# Patient Record
Sex: Female | Born: 1955 | Race: Black or African American | Hispanic: No | Marital: Married | Smoking: Never smoker
Health system: Southern US, Community
[De-identification: ages and names within clinical notes are randomized; demographics above are authoritative.]

## PROBLEM LIST (undated history)

## (undated) DIAGNOSIS — E7800 Pure hypercholesterolemia, unspecified: Secondary | ICD-10-CM

## (undated) DIAGNOSIS — I10 Essential (primary) hypertension: Secondary | ICD-10-CM

## (undated) DIAGNOSIS — T7840XA Allergy, unspecified, initial encounter: Secondary | ICD-10-CM

## (undated) DIAGNOSIS — M1990 Unspecified osteoarthritis, unspecified site: Secondary | ICD-10-CM

## (undated) HISTORY — DX: Allergy, unspecified, initial encounter: T78.40XA

## (undated) HISTORY — PX: AXILLARY SURGERY: SHX892

## (undated) HISTORY — PX: OTHER SURGICAL HISTORY: SHX169

## (undated) HISTORY — PX: ECTOPIC PREGNANCY SURGERY: SHX613

## (undated) HISTORY — DX: Unspecified osteoarthritis, unspecified site: M19.90

## (undated) HISTORY — PX: FOOT SURGERY: SHX648

## (undated) HISTORY — DX: Pure hypercholesterolemia, unspecified: E78.00

## (undated) HISTORY — DX: Essential (primary) hypertension: I10

## (undated) HISTORY — PX: HAND SURGERY: SHX662

## (undated) HISTORY — PX: TONSILLECTOMY: SUR1361

---

## 2002-02-07 ENCOUNTER — Other Ambulatory Visit: Admission: RE | Admit: 2002-02-07 | Discharge: 2002-02-07 | Payer: Self-pay | Admitting: Obstetrics & Gynecology

## 2002-02-23 ENCOUNTER — Encounter: Payer: Self-pay | Admitting: Obstetrics & Gynecology

## 2002-02-23 ENCOUNTER — Encounter: Admission: RE | Admit: 2002-02-23 | Discharge: 2002-02-23 | Payer: Self-pay | Admitting: Obstetrics & Gynecology

## 2002-10-26 ENCOUNTER — Ambulatory Visit (HOSPITAL_COMMUNITY): Admission: RE | Admit: 2002-10-26 | Discharge: 2002-10-26 | Payer: Self-pay | Admitting: Gastroenterology

## 2003-07-18 ENCOUNTER — Other Ambulatory Visit: Admission: RE | Admit: 2003-07-18 | Discharge: 2003-07-18 | Payer: Self-pay | Admitting: Obstetrics & Gynecology

## 2003-08-12 ENCOUNTER — Encounter: Admission: RE | Admit: 2003-08-12 | Discharge: 2003-08-12 | Payer: Self-pay | Admitting: Obstetrics & Gynecology

## 2005-08-09 ENCOUNTER — Encounter: Admission: RE | Admit: 2005-08-09 | Discharge: 2005-08-09 | Payer: Self-pay | Admitting: Obstetrics & Gynecology

## 2006-08-15 ENCOUNTER — Encounter: Admission: RE | Admit: 2006-08-15 | Discharge: 2006-08-15 | Payer: Self-pay | Admitting: Obstetrics & Gynecology

## 2009-09-11 ENCOUNTER — Encounter: Admission: RE | Admit: 2009-09-11 | Discharge: 2009-09-11 | Payer: Self-pay | Admitting: Obstetrics & Gynecology

## 2010-03-01 HISTORY — PX: CHEST WALL TUMOR EXCISION: SUR562

## 2010-03-01 HISTORY — PX: OTHER SURGICAL HISTORY: SHX169

## 2010-03-22 ENCOUNTER — Encounter: Payer: Self-pay | Admitting: Obstetrics & Gynecology

## 2010-07-17 NOTE — Op Note (Signed)
   NAMESAYLAH, KETNER                    ACCOUNT NO.:  [id number]   MEDICAL RECORD NO.:  [id number]                   PATIENT TYPE:  AMB   LOCATION:  ENDO                                 FACILITY:  [facility]   PHYSICIAN:  Danise Edge, M.D.                DATE OF BIRTH:  10/26/1955   DATE OF PROCEDURE:  10/26/2002  DATE OF DISCHARGE:                                 OPERATIVE REPORT   PROCEDURE:  Diagnostic colonoscopy.   INDICATIONS FOR PROCEDURE:  Ms. Charmon Thorson is a 55 year old female  born 1955/04/17. When Ms. Camilo was diagnosed with iron deficiency  anemia, she was placed on iron sulfate three times daily. On August 27, 2002,  her serum iron saturation was measured at 75% and her hemoglobin was 11.7 g.  She tells me she was instructed to decrease her iron to every other day. On  October 17, 2002, her serum iron was 137 and her hemoglobin 12.2 g (Jul 03, 2002 serum iron saturation 3%).   Ms. Fein is scheduled to undergo her first screening colonoscopy with  polypectomy to prevent colon cancer.   ENDOSCOPIST:  Charolett Bumpers, M.D.   PREMEDICATION:  Versed 7.5 mg, Demerol 50 mg .   DESCRIPTION OF PROCEDURE:  After obtaining informed consent, Ms. Laflam was  placed in the left lateral decubitus position. I administered intravenous  Demerol and intravenous Versed to achieve conscious sedation for the  procedure. The patient's blood pressure, oxygen saturation and cardiac  rhythm were monitored throughout the procedure and documented in the medical  record.   Anal inspection was normal. Digital rectal exam was normal. The Olympus  adult colonoscope was introduced into the rectum and easily advanced to the  cecum. Colonic preparation for the exam today was excellent.   RECTUM:  Normal.   SIGMOID COLON AND DESCENDING COLON:  Normal.   SPLENIC FLEXURE:  Normal.   TRANSVERSE COLON:  Normal.   HEPATIC FLEXURE:  Normal.   ASCENDING COLON:  Normal.   CECUM AND ILEOCECAL VALVE:  Normal.   ASSESSMENT:  Normal proctocolonoscopy to the cecum. No endoscopic evidence  for the presence of colorectal neoplasia, lower gastrointestinal bleeding or  inflammatory bowel disease.                                               Danise Edge, M.D.    MJ/MEDQ  D:  10/26/2002  T:  10/26/2002  Job:  [id number]   cc:   Georgann Housekeeper, M.D.  [address]  [place]  [place] [address]  Fax: [phone]   Lilla Shook, M.D.  [address]  [place]  [place]  [address]  Fax: [phone]   [facility]

## 2010-07-31 ENCOUNTER — Encounter (INDEPENDENT_AMBULATORY_CARE_PROVIDER_SITE_OTHER): Payer: Self-pay | Admitting: Surgery

## 2010-08-28 ENCOUNTER — Ambulatory Visit (INDEPENDENT_AMBULATORY_CARE_PROVIDER_SITE_OTHER): Payer: 59 | Admitting: Surgery

## 2010-08-28 ENCOUNTER — Encounter (INDEPENDENT_AMBULATORY_CARE_PROVIDER_SITE_OTHER): Payer: Self-pay | Admitting: Surgery

## 2010-08-28 DIAGNOSIS — L02412 Cutaneous abscess of left axilla: Secondary | ICD-10-CM

## 2010-08-28 DIAGNOSIS — IMO0002 Reserved for concepts with insufficient information to code with codable children: Secondary | ICD-10-CM

## 2010-08-28 NOTE — Progress Notes (Signed)
Subjective:     Patient ID: Bethany Day, female   DOB: May 07, 1955, 55 y.o.   MRN: [id number]    There were no vitals taken for this visit.    HPI  She is doing well. She still reports some drainage from the left axilla. She has no pain. Review of Systems     Objective:   Physical Exam    On exam, the wound is clean. I probed with a Q-tip and again there is a small cavity. There is no purulence. Assessment:    Healing left axillary abscess which I suspect is sebaceous cyst    Plan:       I will have her followup to schedule surgery.

## 2010-10-21 ENCOUNTER — Telehealth (INDEPENDENT_AMBULATORY_CARE_PROVIDER_SITE_OTHER): Payer: Self-pay | Admitting: General Surgery
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
## 2010-11-10 ENCOUNTER — Ambulatory Visit (INDEPENDENT_AMBULATORY_CARE_PROVIDER_SITE_OTHER): Payer: Managed Care, Other (non HMO) | Admitting: Surgery

## 2010-11-10 ENCOUNTER — Encounter (INDEPENDENT_AMBULATORY_CARE_PROVIDER_SITE_OTHER): Payer: Self-pay | Admitting: Surgery

## 2010-11-10 VITALS — BP 132/80 | HR 68 | Temp 97.2°F | Ht 67.0 in | Wt 236.6 lb

## 2010-11-10 DIAGNOSIS — L723 Sebaceous cyst: Secondary | ICD-10-CM

## 2010-11-10 DIAGNOSIS — L089 Local infection of the skin and subcutaneous tissue, unspecified: Secondary | ICD-10-CM

## 2010-11-10 DIAGNOSIS — R2240 Localized swelling, mass and lump, unspecified lower limb: Secondary | ICD-10-CM

## 2010-11-10 DIAGNOSIS — R229 Localized swelling, mass and lump, unspecified: Secondary | ICD-10-CM

## 2010-11-10 NOTE — Progress Notes (Signed)
Subjective:     Patient ID: Bethany Day, female   DOB: 1955/06/30, 55 y.o.   MRN: [id number]  HPI  She is here for followup of her infected sebaceous cyst in the left axilla. He is still intermittently drains. She has not had to be on antibiotics for several months. Review of Systems     Objective:   Physical Exam    On exam, she still has the open area in the left axilla. There is no large adenopathy. Her lungs are clear bilaterally. Cardiovascular regular rate and rhythm. She has a 1.5 cm mass it is superficial on her left anterior thigh. It is mobile. 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 I discussed the risk of surgery which include but not limited to bleeding, infection, recurrence, etc. She understands and wishes to proceed

## 2010-11-13 ENCOUNTER — Encounter (HOSPITAL_COMMUNITY)
Admission: RE | Admit: 2010-11-13 | Discharge: 2010-11-13 | Disposition: A | Discharge status: Home / Self Care | Payer: Managed Care, Other (non HMO) | Source: Ambulatory Visit | Attending: Surgery | Admitting: Surgery

## 2010-11-13 ENCOUNTER — Other Ambulatory Visit (INDEPENDENT_AMBULATORY_CARE_PROVIDER_SITE_OTHER): Payer: Self-pay | Admitting: Surgery

## 2010-11-13 DIAGNOSIS — IMO0002 Reserved for concepts with insufficient information to code with codable children: Secondary | ICD-10-CM

## 2010-11-13 LAB — BASIC METABOLIC PANEL
BUN: 12 mg/dL (ref 6–23)
Creatinine, Ser: 0.7 mg/dL (ref 0.50–1.10)
GFR calc Af Amer: >60 mL/min (ref >60)
GFR calc non Af Amer: >60 mL/min (ref >60)
Glucose, Bld: 104 mg/dL — ABNORMAL HIGH (ref 70–99)
Potassium: 3.9 mEq/L (ref 3.5–5.1)

## 2010-11-13 LAB — CBC
Hemoglobin: 13.6 g/dL (ref 12.0–15.0)
Platelets: 253 10*3/uL (ref 150–400)
RBC: 4.58 MIL/uL (ref 3.87–5.11)

## 2010-11-18 ENCOUNTER — Ambulatory Visit (HOSPITAL_COMMUNITY)
Admission: RE | Admit: 2010-11-18 | Discharge: 2010-11-18 | Disposition: A | Discharge status: Home / Self Care | Payer: Managed Care, Other (non HMO) | Source: Ambulatory Visit | Attending: Surgery | Admitting: Surgery

## 2010-11-18 ENCOUNTER — Other Ambulatory Visit (INDEPENDENT_AMBULATORY_CARE_PROVIDER_SITE_OTHER): Payer: Self-pay | Admitting: Surgery

## 2010-11-18 DIAGNOSIS — Z01810 Encounter for preprocedural cardiovascular examination: Secondary | ICD-10-CM | POA: Insufficient documentation

## 2010-11-18 DIAGNOSIS — L723 Sebaceous cyst: Secondary | ICD-10-CM | POA: Insufficient documentation

## 2010-11-18 DIAGNOSIS — Z01812 Encounter for preprocedural laboratory examination: Secondary | ICD-10-CM | POA: Insufficient documentation

## 2010-11-18 DIAGNOSIS — D1739 Benign lipomatous neoplasm of skin and subcutaneous tissue of other sites: Secondary | ICD-10-CM

## 2010-11-18 DIAGNOSIS — Q828 Other specified congenital malformations of skin: Secondary | ICD-10-CM | POA: Insufficient documentation

## 2010-11-18 DIAGNOSIS — Z01818 Encounter for other preprocedural examination: Secondary | ICD-10-CM | POA: Insufficient documentation

## 2010-11-19 ENCOUNTER — Telehealth (INDEPENDENT_AMBULATORY_CARE_PROVIDER_SITE_OTHER): Payer: Self-pay | Admitting: General Surgery

## 2010-11-19 NOTE — Telephone Encounter (Signed)
Called in [facility] for Bactrim DS 1 po Bid for 7 day

## 2010-11-30 NOTE — Op Note (Signed)
  NAMEKIANDRA, SANGUINETTI               ACCOUNT NO.:  [id number]  MEDICAL RECORD NO.:  [id number]  LOCATION:  SDSC                         FACILITY:  [facility]  PHYSICIAN:  Abigail Miyamoto, M.D. DATE OF BIRTH:  Mar 31, 1955  DATE OF PROCEDURE:  11/18/2010 DATE OF DISCHARGE:                              OPERATIVE REPORT   PREOPERATIVE DIAGNOSES: 1. 1-cm left leg skin mass. 2. Left axillary sebaceous cyst. 3. Left chest wall 2 mm skin tag.  POSTOPERATIVE DIAGNOSES: 1. 1-cm left leg skin mass. 2. Left axillary sebaceous cyst. 3. Left chest wall 2 mm skin tag.  PROCEDURES: 1. Excision of 1-cm left leg skin mass. 2. Excision of left axillary chronic sebaceous cyst. 3. Excision of 2-mm left chest wall skin tag.  SURGEON:  Abigail Miyamoto, MD  ANESTHESIA:  1% lidocaine monitored anesthesia care.  ESTIMATED BLOOD LOSS:  Minimal.  PROCEDURE IN DETAIL:  The patient was brought to the operating room, identified as Bethany Day.  She was placed supine on the operating room table and anesthesia was induced.  Her left leg, left axilla and chest were then prepped and draped in the usual sterile fashion.  I anesthetized the skin of the left leg around the mass with 1% lidocaine. The mass was a raised soft fleshy lesion.  I was able to grasp it and then performed an elliptical incision around the mass with a scalpel.  I then completed the excision with electrocautery.  The mass was sent to pathology.  I then closed the subcutaneous tissue with interrupted 3-0 Vicryl sutures and closed the skin with running 4-0 Monocryl.  Steri- Strips were applied.  Next, I anesthetized skin of the axilla as well as the very tiny skin tag on her chest wall with lidocaine.  I excised the skin tag with the electrocautery.  I then performed an elliptical incision around the chronic sebaceous cyst in the axilla with a scalpel. I took this down to the axillary tissue with electrocautery.  I then performed excision  of the sebaceous cyst with the cautery.  I then closed the subcutaneous tissue with interrupted 3-0 Vicryl sutures and closed the skin with running 4-0 Monocryl.  Steri-Strips, gauze and tape were then applied. The patient tolerated the procedure well.  All counts were correct at the end of procedure.  The patient was then taken in stable condition from the operating room to the recovery room.     Abigail Miyamoto, M.D.     DB/MEDQ  D:  11/18/2010  T:  11/18/2010  Job:  [id number]  Electronically Signed by Abigail Miyamoto M.D. on 11/30/2010 09:10:25 AM

## 2010-12-02 ENCOUNTER — Encounter (INDEPENDENT_AMBULATORY_CARE_PROVIDER_SITE_OTHER): Payer: Self-pay | Admitting: Surgery

## 2010-12-07 ENCOUNTER — Encounter (INDEPENDENT_AMBULATORY_CARE_PROVIDER_SITE_OTHER): Payer: Self-pay | Admitting: Surgery

## 2010-12-09 ENCOUNTER — Encounter (INDEPENDENT_AMBULATORY_CARE_PROVIDER_SITE_OTHER): Payer: Self-pay | Admitting: Surgery

## 2010-12-09 ENCOUNTER — Ambulatory Visit (INDEPENDENT_AMBULATORY_CARE_PROVIDER_SITE_OTHER): Payer: Managed Care, Other (non HMO) | Admitting: Surgery

## 2010-12-09 VITALS — BP 143/91 | HR 66 | Temp 97.1°F | Resp 11 | Ht 67.0 in | Wt <= 1120 oz

## 2010-12-09 DIAGNOSIS — Z09 Encounter for follow-up examination after completed treatment for conditions other than malignant neoplasm: Secondary | ICD-10-CM

## 2010-12-09 NOTE — Progress Notes (Signed)
Subjective:     Patient ID: Bethany Day, female   DOB: [birth year], 55 y.o.   MRN: [id number]  HPI  She is here for her postoperative visit status post excision of a chronic infected sebaceous cyst in the left axilla as well as a left thigh mass. She is doing well and has no complaints. Review of Systems     Objective:   Physical Exam    On exam, both incisions are well-healed with no evidence of infection. The pathology on both lesions was benign Assessment:     Patient stable postop    Plan:     I will see her back as needed

## 2011-07-14 ENCOUNTER — Other Ambulatory Visit: Payer: Self-pay | Admitting: Obstetrics & Gynecology

## 2011-07-14 DIAGNOSIS — Z1231 Encounter for screening mammogram for malignant neoplasm of breast: Secondary | ICD-10-CM

## 2011-07-22 ENCOUNTER — Ambulatory Visit
Admission: RE | Admit: 2011-07-22 | Discharge: 2011-07-22 | Disposition: A | Discharge status: Home / Self Care | Payer: Managed Care, Other (non HMO) | Source: Ambulatory Visit | Attending: Obstetrics & Gynecology | Admitting: Obstetrics & Gynecology

## 2011-07-22 DIAGNOSIS — Z1231 Encounter for screening mammogram for malignant neoplasm of breast: Secondary | ICD-10-CM

## 2012-06-06 ENCOUNTER — Encounter (INDEPENDENT_AMBULATORY_CARE_PROVIDER_SITE_OTHER): Payer: BC Managed Care – PPO | Admitting: Ophthalmology

## 2012-06-06 DIAGNOSIS — H43819 Vitreous degeneration, unspecified eye: Secondary | ICD-10-CM

## 2012-06-06 DIAGNOSIS — I10 Essential (primary) hypertension: Secondary | ICD-10-CM

## 2012-06-06 DIAGNOSIS — H35039 Hypertensive retinopathy, unspecified eye: Secondary | ICD-10-CM

## 2012-06-06 DIAGNOSIS — H2510 Age-related nuclear cataract, unspecified eye: Secondary | ICD-10-CM

## 2012-07-13 ENCOUNTER — Other Ambulatory Visit: Payer: Self-pay

## 2012-07-13 DIAGNOSIS — Z1231 Encounter for screening mammogram for malignant neoplasm of breast: Secondary | ICD-10-CM

## 2012-08-11 ENCOUNTER — Ambulatory Visit
Admission: RE | Admit: 2012-08-11 | Discharge: 2012-08-11 | Disposition: A | Discharge status: Home / Self Care | Payer: BC Managed Care – PPO | Source: Ambulatory Visit

## 2012-08-11 DIAGNOSIS — Z1231 Encounter for screening mammogram for malignant neoplasm of breast: Secondary | ICD-10-CM

## 2013-08-24 ENCOUNTER — Other Ambulatory Visit: Payer: Self-pay

## 2013-08-24 DIAGNOSIS — Z1231 Encounter for screening mammogram for malignant neoplasm of breast: Secondary | ICD-10-CM

## 2013-08-28 ENCOUNTER — Encounter (INDEPENDENT_AMBULATORY_CARE_PROVIDER_SITE_OTHER): Payer: Self-pay

## 2013-08-28 ENCOUNTER — Ambulatory Visit
Admission: RE | Admit: 2013-08-28 | Discharge: 2013-08-28 | Disposition: A | Discharge status: Home / Self Care | Payer: BC Managed Care – PPO | Source: Ambulatory Visit

## 2013-08-28 DIAGNOSIS — Z1231 Encounter for screening mammogram for malignant neoplasm of breast: Secondary | ICD-10-CM

## 2014-07-31 ENCOUNTER — Encounter: Payer: Self-pay | Admitting: Obstetrics & Gynecology

## 2015-08-19 ENCOUNTER — Emergency Department (HOSPITAL_COMMUNITY): Payer: No Typology Code available for payment source

## 2015-08-19 ENCOUNTER — Encounter (HOSPITAL_COMMUNITY): Payer: Self-pay | Admitting: *Deleted

## 2015-08-19 ENCOUNTER — Emergency Department (HOSPITAL_COMMUNITY)
Admission: EM | Admit: 2015-08-19 | Discharge: 2015-08-19 | Disposition: A | Discharge status: Home / Self Care | Payer: No Typology Code available for payment source | Attending: Emergency Medicine | Admitting: Emergency Medicine

## 2015-08-19 DIAGNOSIS — Y939 Activity, unspecified: Secondary | ICD-10-CM | POA: Insufficient documentation

## 2015-08-19 DIAGNOSIS — Z91040 Latex allergy status: Secondary | ICD-10-CM | POA: Diagnosis not present

## 2015-08-19 DIAGNOSIS — T22032A Burn of unspecified degree of left upper arm, initial encounter: Secondary | ICD-10-CM | POA: Insufficient documentation

## 2015-08-19 DIAGNOSIS — J45909 Unspecified asthma, uncomplicated: Secondary | ICD-10-CM | POA: Diagnosis not present

## 2015-08-19 DIAGNOSIS — Y999 Unspecified external cause status: Secondary | ICD-10-CM | POA: Diagnosis not present

## 2015-08-19 DIAGNOSIS — I10 Essential (primary) hypertension: Secondary | ICD-10-CM | POA: Diagnosis not present

## 2015-08-19 DIAGNOSIS — Y92410 Unspecified street and highway as the place of occurrence of the external cause: Secondary | ICD-10-CM | POA: Insufficient documentation

## 2015-08-19 DIAGNOSIS — W2210XA Striking against or struck by unspecified automobile airbag, initial encounter: Secondary | ICD-10-CM

## 2015-08-19 DIAGNOSIS — T22011A Burn of unspecified degree of right forearm, initial encounter: Secondary | ICD-10-CM | POA: Diagnosis not present

## 2015-08-19 DIAGNOSIS — S8001XA Contusion of right knee, initial encounter: Secondary | ICD-10-CM

## 2015-08-19 DIAGNOSIS — S8991XA Unspecified injury of right lower leg, initial encounter: Secondary | ICD-10-CM | POA: Diagnosis present

## 2015-08-19 MED ORDER — CYCLOBENZAPRINE HCL 10 MG PO TABS: 10.0000 mg | ORAL_TABLET | Freq: Two times a day (BID) | ORAL | Status: DC | PRN

## 2015-08-19 MED ORDER — IBUPROFEN 800 MG PO TABS: 800.0000 mg | ORAL_TABLET | Freq: Three times a day (TID) | ORAL | Status: DC
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
## 2016-06-30 ENCOUNTER — Emergency Department (HOSPITAL_COMMUNITY)
Admission: EM | Admit: 2016-06-30 | Discharge: 2016-06-30 | Disposition: A | Discharge status: Home / Self Care | Payer: BC Managed Care – PPO | Attending: Emergency Medicine | Admitting: Emergency Medicine

## 2016-06-30 ENCOUNTER — Emergency Department (HOSPITAL_COMMUNITY): Payer: BC Managed Care – PPO

## 2016-06-30 ENCOUNTER — Encounter (HOSPITAL_COMMUNITY): Payer: Self-pay | Admitting: Emergency Medicine

## 2016-06-30 DIAGNOSIS — W1830XA Fall on same level, unspecified, initial encounter: Secondary | ICD-10-CM | POA: Insufficient documentation

## 2016-06-30 DIAGNOSIS — Z91040 Latex allergy status: Secondary | ICD-10-CM | POA: Insufficient documentation

## 2016-06-30 DIAGNOSIS — S60222A Contusion of left hand, initial encounter: Secondary | ICD-10-CM | POA: Insufficient documentation

## 2016-06-30 DIAGNOSIS — I10 Essential (primary) hypertension: Secondary | ICD-10-CM | POA: Insufficient documentation

## 2016-06-30 DIAGNOSIS — Y990 Civilian activity done for income or pay: Secondary | ICD-10-CM | POA: Diagnosis not present

## 2016-06-30 DIAGNOSIS — Y939 Activity, unspecified: Secondary | ICD-10-CM | POA: Diagnosis not present

## 2016-06-30 DIAGNOSIS — S20212A Contusion of left front wall of thorax, initial encounter: Secondary | ICD-10-CM | POA: Diagnosis not present

## 2016-06-30 DIAGNOSIS — S6992XA Unspecified injury of left wrist, hand and finger(s), initial encounter: Secondary | ICD-10-CM | POA: Diagnosis present

## 2016-06-30 DIAGNOSIS — W19XXXA Unspecified fall, initial encounter: Secondary | ICD-10-CM

## 2016-06-30 DIAGNOSIS — J45909 Unspecified asthma, uncomplicated: Secondary | ICD-10-CM | POA: Diagnosis not present

## 2016-06-30 DIAGNOSIS — Z79899 Other long term (current) drug therapy: Secondary | ICD-10-CM | POA: Insufficient documentation

## 2016-06-30 DIAGNOSIS — Y929 Unspecified place or not applicable: Secondary | ICD-10-CM | POA: Diagnosis not present

## 2016-06-30 MED ORDER — CYCLOBENZAPRINE HCL 10 MG PO TABS: 10.0000 mg | ORAL_TABLET | Freq: Two times a day (BID) | ORAL | 0 refills | Status: DC | PRN

## 2016-06-30 MED ORDER — IBUPROFEN 600 MG PO TABS: 600.0000 mg | ORAL_TABLET | Freq: Three times a day (TID) | ORAL | 0 refills | Status: DC

## 2016-06-30 NOTE — ED Provider Notes (Signed)
[facility] DEPT Provider Note   CSN: [id number] Arrival date & time: 06/30/16  4098     History   Chief Complaint Chief Complaint  Patient presents with  . Fall    HPI Bethany Day is a 61 y.o. female.  Pt presents to the ED today with left rib and left hand pain after a fall while at work.  She slipped while standing.  The pt did not hit hear head or have a loc.      Past Medical History:  Diagnosis Date  . Arthritis   . Asthma   . Hypertension     There are no active problems to display for this patient.   Past Surgical History:  Procedure Laterality Date  . AXILLARY SURGERY    . CHEST WALL TUMOR EXCISION  2012   skin tags  . ECTOPIC PREGNANCY SURGERY    . FOOT SURGERY    . HAND SURGERY    . left axill  2012   sebaceous cyst  . left knee    . leg mass  2012   left leg skin mass  . TONSILLECTOMY      OB History    No data available       Home Medications    Prior to Admission medications   Medication Sig Start Date End Date Taking? Authorizing Provider  amLODipine-benazepril (LOTREL) 5-20 MG capsule Take 1 capsule by mouth daily. 07/16/15   Historical Provider, MD  cyclobenzaprine (FLEXERIL) 10 MG tablet Take 1 tablet (10 mg total) by mouth 2 (two) times daily as needed for muscle spasms. 06/30/16   Isla Pence, MD  ibuprofen (ADVIL,MOTRIN) 600 MG tablet Take 1 tablet (600 mg total) by mouth 3 (three) times daily. 06/30/16   Isla Pence, MD    Family History Family History  Problem Relation Age of Onset  . Arthritis Mother     Social History Social History  Substance Use Topics  . Smoking status: Never Smoker  . Smokeless tobacco: Never Used  . Alcohol use Yes     Comment: rarely glass of wine     Allergies   Latex   Review of Systems Review of Systems  Musculoskeletal:       Left rib, left hand pain  All other systems reviewed and are negative.    Physical Exam Updated Vital Signs BP (!) 152/70 (BP Location: Left  Arm)   Pulse 70   Temp 98 F (36.7 C) (Oral)   Resp 14   Ht 5\' 7"  (1.702 m)   Wt 240 lb (108.9 kg)   SpO2 100%   BMI 37.59 kg/m   Physical Exam  Constitutional: She is oriented to person, place, and time. She appears well-developed and well-nourished.  HENT:  Head: Normocephalic and atraumatic.  Right Ear: External ear normal.  Left Ear: External ear normal.  Nose: Nose normal.  Mouth/Throat: Oropharynx is clear and moist.  Eyes: Conjunctivae and EOM are normal. Pupils are equal, round, and reactive to light.  Neck: Normal range of motion. Neck supple.  Cardiovascular: Normal rate, regular rhythm, normal heart sounds and intact distal pulses.   Pulmonary/Chest: Effort normal and breath sounds normal.  Abdominal: Soft. Bowel sounds are normal.  Musculoskeletal:       Arms: Left hand tenderness in the left thenar region.  No swelling or redness.  Neurological: She is alert and oriented to person, place, and time.  Skin: Skin is warm and dry.  Psychiatric: She has a normal  mood and affect. Her behavior is normal. Judgment and thought content normal.  Nursing note and vitals reviewed.    ED Treatments / Results  Labs (all labs ordered are listed, but only abnormal results are displayed) Labs Reviewed - No data to display  EKG  EKG Interpretation None       Radiology Dg Ribs Unilateral W/chest Left  Result Date: 06/30/2016 CLINICAL DATA:  Status post fall earlier today striking the left lateral ribcage. Patient reports pain but no shortness of breath. EXAM: LEFT RIBS AND CHEST - 3+ VIEW COMPARISON:  PA and lateral chest x-ray of November 13, 2010 FINDINGS: The lungs are well-expanded. There is no focal infiltrate. There is no pleural effusion, pulmonary contusion, or pneumothorax. The heart and pulmonary vascularity are normal. The mediastinum is normal in width. Left rib detail images include a metallic BB over the lower lateral ribcage. No acute displaced or nondisplaced  fracture is observed. IMPRESSION: No evidence of acute left rib fracture. No acute cardiopulmonary abnormality. Electronically Signed   By: David  Martinique M.D.   On: 06/30/2016 10:30   Dg Hand Complete Left  Result Date: 06/30/2016 CLINICAL DATA:  Left hand pain about the first metacarpal since a fall this morning. Initial encounter. EXAM: LEFT HAND - COMPLETE 3+ VIEW COMPARISON:  None. FINDINGS: There is no acute bony or joint abnormality. 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 Soft tissues are unremarkable. IMPRESSION: No acute abnormality. 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 Electronically Signed   By: Inge Rise M.D.   On: 06/30/2016 10:29    Procedures Procedures (including critical care time)  Medications Ordered in ED Medications - No data to display   Initial Impression / Assessment and Plan / ED Course  I have reviewed the triage vital signs and the nursing notes.  Pertinent labs & imaging results that were available during my care of the patient were reviewed by me and considered in my medical decision making (see chart for details).    Pt did not want any pain medication while here.  She did not want any narcotics for home.  She is willing to take flexeril.  She knows to return if worse and to f/u with pcp.  Final Clinical Impressions(s) / ED Diagnoses   Final diagnoses:  Fall, initial encounter  Rib contusion, left, initial encounter  Contusion of left hand, initial encounter    New Prescriptions Current Discharge Medication List       Isla Pence, MD 06/30/16 1100

## 2016-06-30 NOTE — ED Triage Notes (Signed)
Per EMS patient fell while working.  States she slipped while standing.  Denies head injury, LOC.  Reports lower back pain and left flank pain.

## 2017-08-17 ENCOUNTER — Other Ambulatory Visit: Payer: Self-pay | Admitting: Internal Medicine

## 2017-08-17 DIAGNOSIS — Z1231 Encounter for screening mammogram for malignant neoplasm of breast: Secondary | ICD-10-CM

## 2017-09-02 ENCOUNTER — Ambulatory Visit
Admission: RE | Admit: 2017-09-02 | Discharge: 2017-09-02 | Disposition: A | Discharge status: Home / Self Care | Payer: BC Managed Care – PPO | Source: Ambulatory Visit | Attending: Internal Medicine | Admitting: Internal Medicine

## 2017-09-02 DIAGNOSIS — Z1231 Encounter for screening mammogram for malignant neoplasm of breast: Secondary | ICD-10-CM

## 2017-12-29 ENCOUNTER — Ambulatory Visit: Payer: BC Managed Care – PPO | Admitting: Obstetrics & Gynecology

## 2017-12-29 ENCOUNTER — Encounter: Payer: Self-pay | Admitting: Obstetrics & Gynecology

## 2017-12-29 VITALS — BP 128/70 | Ht 67.5 in | Wt 230.0 lb

## 2017-12-29 DIAGNOSIS — Z01419 Encounter for gynecological examination (general) (routine) without abnormal findings: Secondary | ICD-10-CM

## 2017-12-29 DIAGNOSIS — Z780 Asymptomatic menopausal state: Secondary | ICD-10-CM

## 2017-12-29 DIAGNOSIS — Z13820 Encounter for screening for osteoporosis: Secondary | ICD-10-CM

## 2017-12-29 DIAGNOSIS — Z6835 Body mass index (BMI) 35.0-35.9, adult: Secondary | ICD-10-CM

## 2017-12-29 DIAGNOSIS — E6609 Other obesity due to excess calories: Secondary | ICD-10-CM

## 2017-12-29 NOTE — Progress Notes (Signed)
Bethany Day 12/03/1955 [id number]   History:    62 y.o. G3P0A3 Married.  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  RP:  Established patient presenting for annual gyn exam   HPI: Menopause, well on no HRT.  No PMB.  No pelvic pain.  Abstinent x 1 year.  Urine/BMs wnl.  Breasts wnl.  BMI 35.49.  Decreased her caloric/carb intake currently and physically active.  Per patient successfully lost a few Lbs.  Health labs with Fam MD.  Past medical history,surgical history, family history and social history were all reviewed and documented in the EPIC chart.  Gynecologic History No LMP recorded. Patient is postmenopausal. Contraception: abstinence and post menopausal status Last Pap: 2017. Results were: normal Last mammogram: 08/2017. Results were: Negative Bone Density: Never.  Will schedule here now. Colonoscopy: 2015  Obstetric History OB History  Gravida Para Term Preterm AB Living  3       3 0  SAB TAB Ectopic Multiple Live Births      1        # Outcome Date GA Lbr Len/2nd Weight Sex Delivery Anes PTL Lv  3 AB           2 AB           1 Ectopic              ROS: A ROS was performed and pertinent positives and negatives are included in the history.  GENERAL: No fevers or chills. HEENT: No change in vision, no earache, sore throat or sinus congestion. NECK: No pain or stiffness. CARDIOVASCULAR: No chest pain or pressure. No palpitations. PULMONARY: No shortness of breath, cough or wheeze. GASTROINTESTINAL: No abdominal pain, nausea, vomiting or diarrhea, melena or bright red blood per rectum. GENITOURINARY: No urinary frequency, urgency, hesitancy or dysuria. MUSCULOSKELETAL: No joint or muscle pain, no back pain, no recent trauma. DERMATOLOGIC: No rash, no itching, no lesions. ENDOCRINE: No polyuria, polydipsia, no heat or cold intolerance. No recent change in weight. HEMATOLOGICAL: No anemia or easy bruising or bleeding. NEUROLOGIC: No headache, seizures, numbness, tingling or  weakness. PSYCHIATRIC: No depression, no loss of interest in normal activity or change in sleep pattern.     Exam:   BP 128/70   Ht 5' 7.5" (1.715 m)   Wt 230 lb (104.3 kg)   BMI 35.49 kg/m   Body mass index is 35.49 kg/m.  General appearance : Well developed well nourished female. No acute distress HEENT: Eyes: no retinal hemorrhage or exudates,  Neck supple, trachea midline, no carotid bruits, no thyroidmegaly Lungs: Clear to auscultation, no rhonchi or wheezes, or rib retractions  Heart: Regular rate and rhythm, no murmurs or gallops Breast:Examined in sitting and supine position were symmetrical in appearance, no palpable masses or tenderness,  no skin retraction, no nipple inversion, no nipple discharge, no skin discoloration, no axillary or supraclavicular lymphadenopathy Abdomen: no palpable masses or tenderness, no rebound or guarding Extremities: no edema or skin discoloration or tenderness  Pelvic: Vulva: Normal             Vagina: No gross lesions or discharge  Cervix: No gross lesions or discharge.  Pap reflex done  Uterus  AV, normal size, shape and consistency, non-tender and mobile  Adnexa  Without masses or tenderness  Anus: Normal   Assessment/Plan:  62 y.o. female for annual exam   1. Encounter for routine gynecological examination with Papanicolaou smear of cervix Normal gynecologic exam.  Pap reflex done.  Breast exam normal.  Screening mammogram July 2019 was negative.  Colonoscopy in 2015.  Health labs with family physician.  2. Postmenopausal Well on no hormone replacement therapy.  No postmenopausal bleeding.  3. Screening for osteoporosis Scheduling bone density here now.  Vitamin D supplements, calcium intake of 1.5 g/day, regular weightbearing physical activity. - DG Bone Density; Future  4. Class 2 obesity due to excess calories without serious comorbidity with body mass index (BMI) of 35.0 to 35.9 in adult Continue with a low calorie/low carb  diet such as [employer].  Aerobic physical activity 5 times a week and weight lifting every 2 days recommended.  Princess Bruins MD, 2:15 PM 12/29/2017

## 2017-12-30 LAB — PAP IG W/ RFLX HPV ASCU

## 2017-12-31 ENCOUNTER — Encounter: Payer: Self-pay | Admitting: Obstetrics & Gynecology

## 2017-12-31 NOTE — Patient Instructions (Signed)
1. Encounter for routine gynecological examination with Papanicolaou smear of cervix Normal gynecologic exam.  Pap reflex done.  Breast exam normal.  Screening mammogram July 2019 was negative.  Colonoscopy in 2015.  Health labs with family physician.  2. Postmenopausal Well on no hormone replacement therapy.  No postmenopausal bleeding.  3. Screening for osteoporosis Scheduling bone density here now.  Vitamin D supplements, calcium intake of 1.5 g/day, regular weightbearing physical activity. - DG Bone Density; Future  4. Class 2 obesity due to excess calories without serious comorbidity with body mass index (BMI) of 35.0 to 35.9 in adult Continue with a low calorie/low carb diet such as [employer].  Aerobic physical activity 5 times a week and weight lifting every 2 days recommended.  Bethany Day, it was a pleasure seeing you today!  I will inform you of your results as soon as they are available.

## 2018-07-06 ENCOUNTER — Other Ambulatory Visit: Payer: Self-pay | Admitting: Internal Medicine

## 2018-07-06 DIAGNOSIS — K219 Gastro-esophageal reflux disease without esophagitis: Secondary | ICD-10-CM

## 2018-07-18 ENCOUNTER — Other Ambulatory Visit: Payer: Self-pay

## 2018-07-18 ENCOUNTER — Other Ambulatory Visit: Payer: Self-pay | Admitting: Internal Medicine

## 2018-07-18 ENCOUNTER — Ambulatory Visit
Admission: RE | Admit: 2018-07-18 | Discharge: 2018-07-18 | Disposition: A | Discharge status: Home / Self Care | Payer: BC Managed Care – PPO | Source: Ambulatory Visit | Attending: Internal Medicine | Admitting: Internal Medicine

## 2018-07-18 DIAGNOSIS — K219 Gastro-esophageal reflux disease without esophagitis: Secondary | ICD-10-CM

## 2018-08-21 ENCOUNTER — Other Ambulatory Visit: Payer: Self-pay | Admitting: Internal Medicine

## 2018-08-21 DIAGNOSIS — E2839 Other primary ovarian failure: Secondary | ICD-10-CM

## 2018-08-21 DIAGNOSIS — Z1231 Encounter for screening mammogram for malignant neoplasm of breast: Secondary | ICD-10-CM

## 2018-11-03 ENCOUNTER — Ambulatory Visit
Admission: RE | Admit: 2018-11-03 | Discharge: 2018-11-03 | Disposition: A | Discharge status: Home / Self Care | Payer: BC Managed Care – PPO | Source: Ambulatory Visit | Attending: Internal Medicine | Admitting: Internal Medicine

## 2018-11-03 ENCOUNTER — Other Ambulatory Visit: Payer: Self-pay

## 2018-11-03 DIAGNOSIS — E2839 Other primary ovarian failure: Secondary | ICD-10-CM

## 2018-11-03 DIAGNOSIS — Z1231 Encounter for screening mammogram for malignant neoplasm of breast: Secondary | ICD-10-CM

## 2018-12-23 ENCOUNTER — Other Ambulatory Visit: Payer: Self-pay

## 2018-12-23 DIAGNOSIS — Z20822 Contact with and (suspected) exposure to covid-19: Secondary | ICD-10-CM

## 2018-12-24 LAB — NOVEL CORONAVIRUS, NAA: SARS-CoV-2, NAA: NOT DETECTED

## 2019-04-12 ENCOUNTER — Ambulatory Visit: Payer: BC Managed Care – PPO | Attending: Family

## 2019-04-12 DIAGNOSIS — Z23 Encounter for immunization: Secondary | ICD-10-CM

## 2019-04-12 NOTE — Progress Notes (Signed)
   Covid-19 Vaccination Clinic  Name:  TANIRA MYNES    MRN: [id number] DOB: 01/05/1956  04/12/2019  Ms. Fliegel was observed post Covid-19 immunization for 15 minutes without incidence. She was provided with Vaccine Information Sheet and instruction to access the V-Safe system.   Ms. Landeros was instructed to call 911 with any severe reactions post vaccine: Marland Kitchen Difficulty breathing  . Swelling of your face and throat  . A fast heartbeat  . A bad rash all over your body  . Dizziness and weakness    Immunizations Administered    Name Date Dose VIS Date Route   Moderna COVID-19 Vaccine 04/12/2019  1:07 PM 0.5 mL 01/30/2019 Intramuscular   Manufacturer: Moderna   Lot: [id number]   [id number]

## 2019-05-15 ENCOUNTER — Ambulatory Visit: Payer: BC Managed Care – PPO | Attending: Family

## 2019-05-15 DIAGNOSIS — Z23 Encounter for immunization: Secondary | ICD-10-CM

## 2019-05-15 NOTE — Progress Notes (Signed)
   Covid-19 Vaccination Clinic  Name:  Bethany Day    MRN: [id number] DOB: 1956/02/17  05/15/2019  [name] was observed post Covid-19 immunization for 15 minutes without incident. She was provided with Vaccine Information Sheet and instruction to access the V-Safe system.   [name] was instructed to call 911 with any severe reactions post vaccine: Marland Kitchen Difficulty breathing  . Swelling of face and throat  . A fast heartbeat  . A bad rash all over body  . Dizziness and weakness   Immunizations Administered    Name Date Dose VIS Date Route   Moderna COVID-19 Vaccine 05/15/2019 12:21 PM 0.5 mL 01/30/2019 Intramuscular   Manufacturer: Moderna   Lot: [id number]   [id number]

## 2019-06-01 IMAGING — RF UPPER GI SERIES W/HIGH DENSITY WITHOUT KUB
7 series · 15 of 23 positions shown · non-contrast
Comparison: None.

CLINICAL DATA: Gastroesophageal reflux disease.

EXAM:
UPPER GI SERIES WITH KUB
TECHNIQUE: After obtaining a scout radiograph a routine upper GI series was
performed using thin and high density barium.
FLUOROSCOPY TIME:  Fluoroscopy Time:  1 minutes 42 second
Radiation Exposure Index (if provided by the fluoroscopic device):
Number of Acquired Spot Images: 0

[Series 1: one shot · 0.14mm/px · 1 of 1 slices shown (1 of 3)]
[im 1/1]
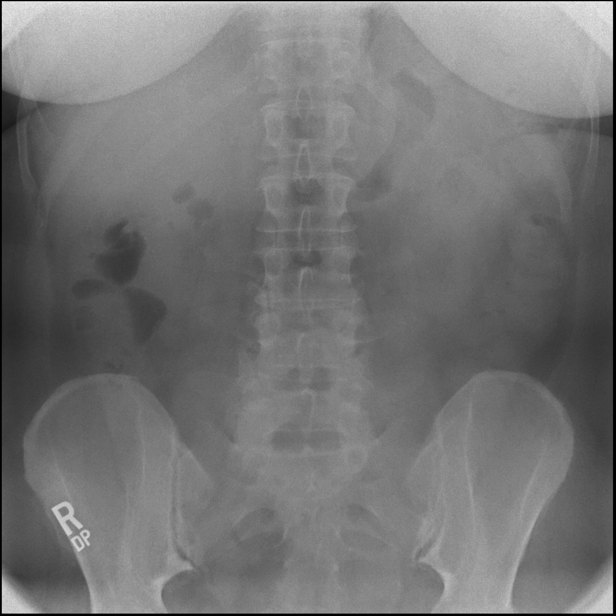

[Series 2: sequence · 0.29mm/px · 2 of 30 frames shown (1 of 4)]
[frame 16/30]
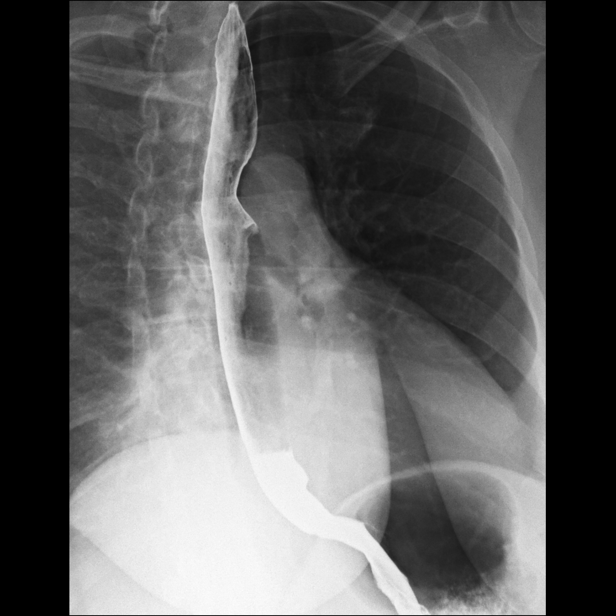
[frame 25/30]
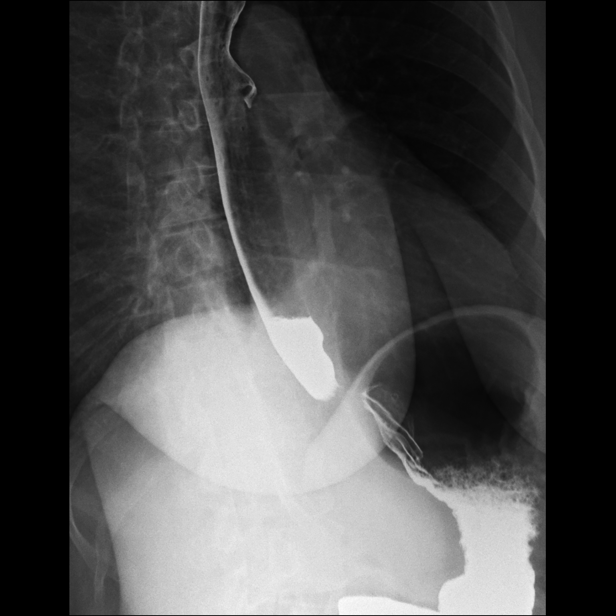

[Series 3: one shot · 0.15mm/px · 4 of 5 slices shown (2 of 3)]
[im 1/5]
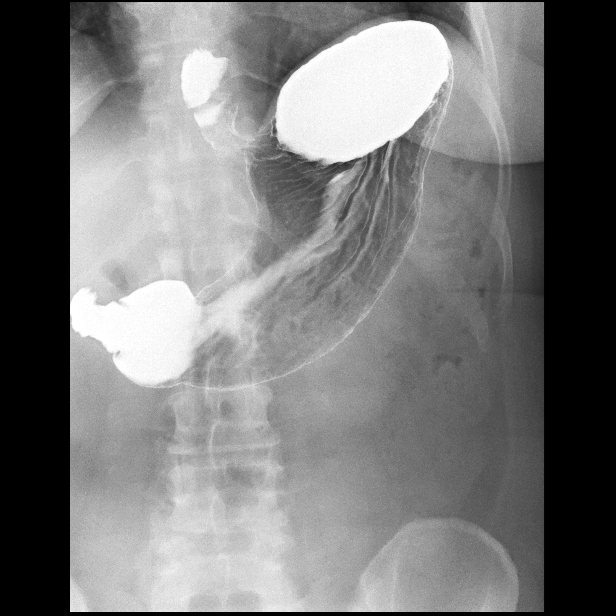
[im 2/5]
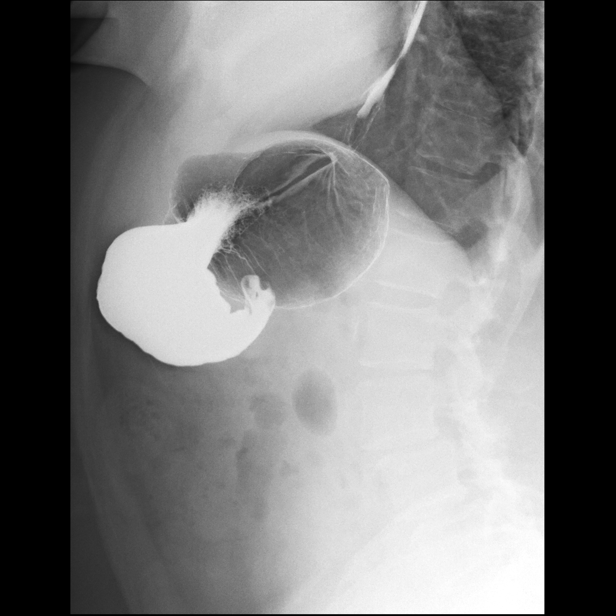
[im 4/5]
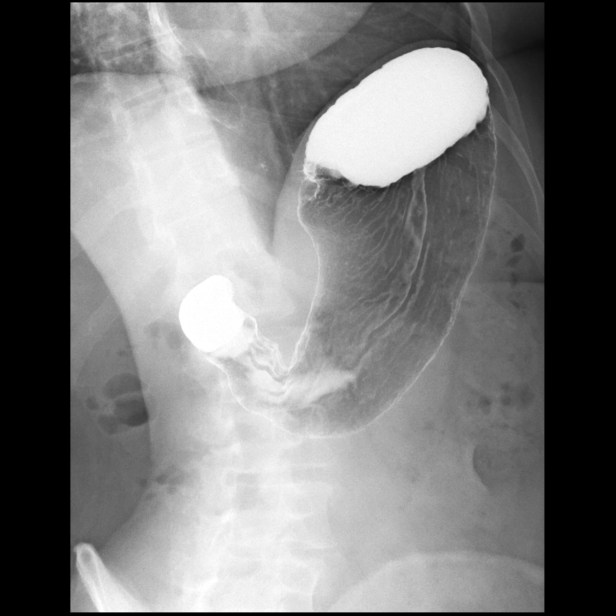
[im 5/5]
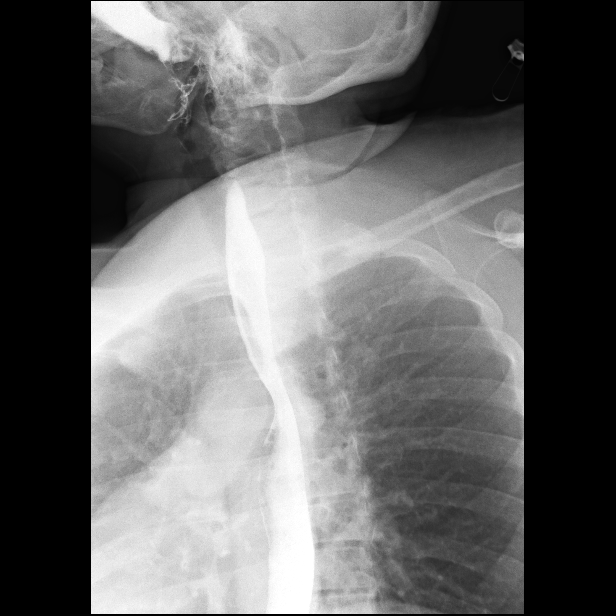

[Series 4: sequence · 0.29mm/px · 2 of 30 frames shown (2 of 4)]
[frame 5/30]
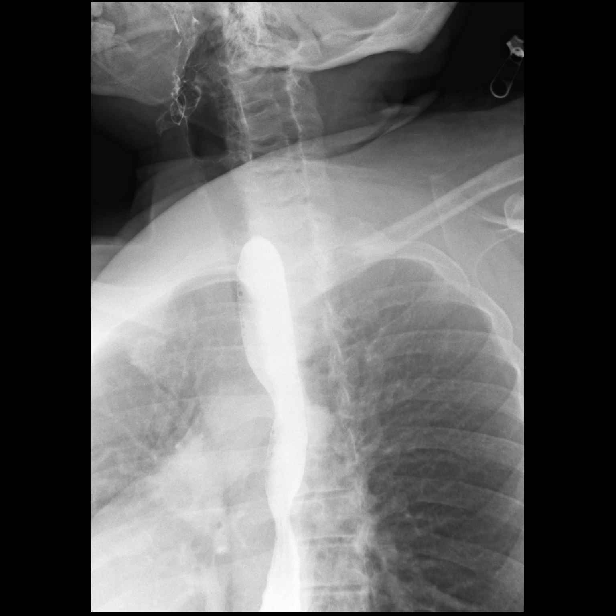
[frame 26/30]
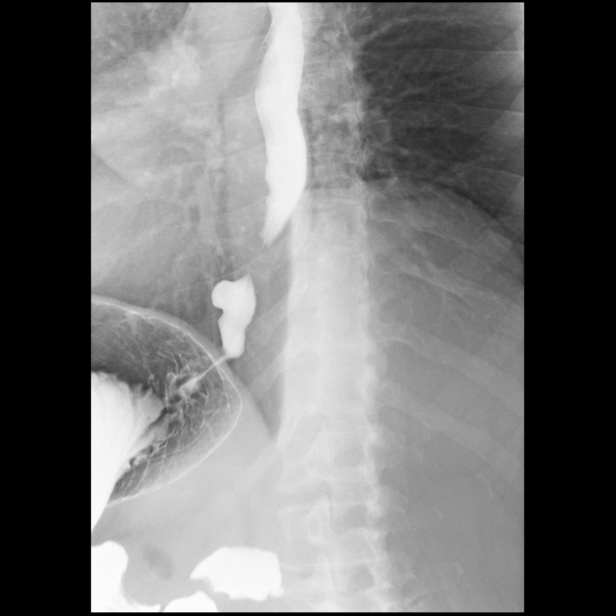

[Series 5: sequence · 0.29mm/px · 2 of 23 frames shown (3 of 4)]
[frame 4/23]
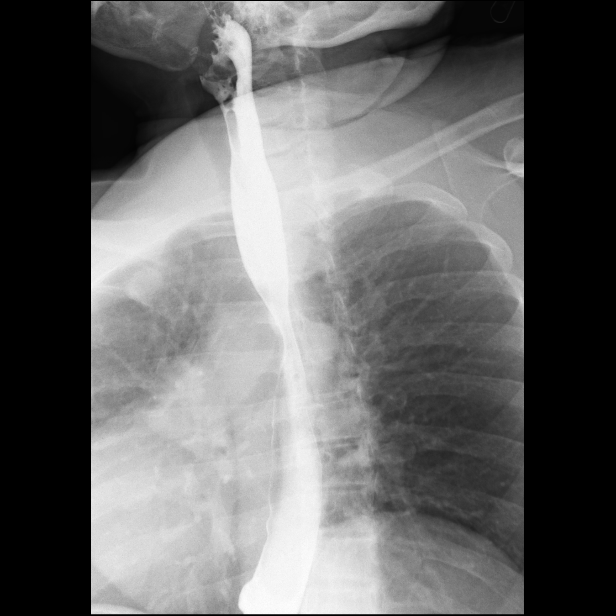
[frame 20/23]
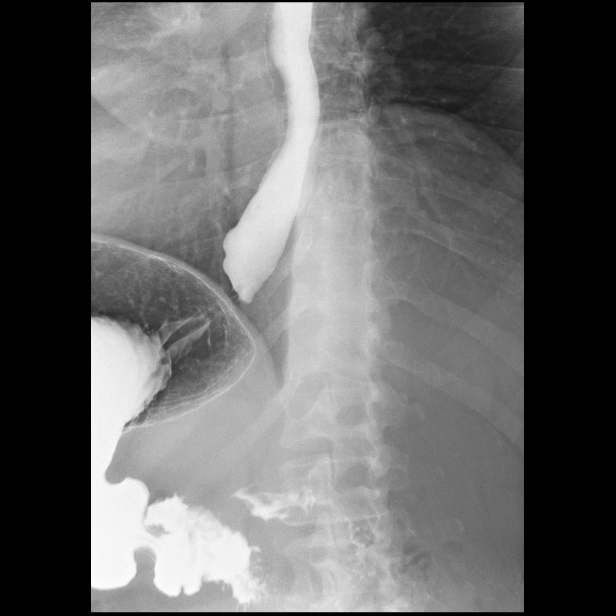

[Series 6: sequence · 0.32mm/px · 1 of 2 frames shown (4 of 4)]
[frame 1/2]
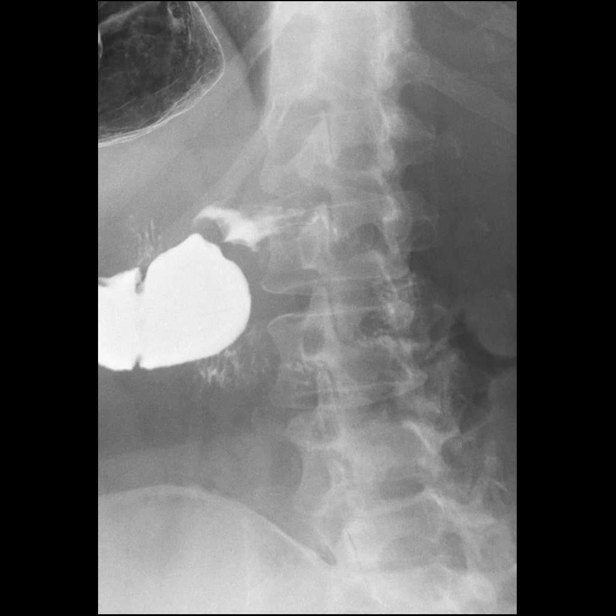

[Series 7: one shot · 0.16mm/px · 3 of 4 slices shown (3 of 3)]
[im 1/4]
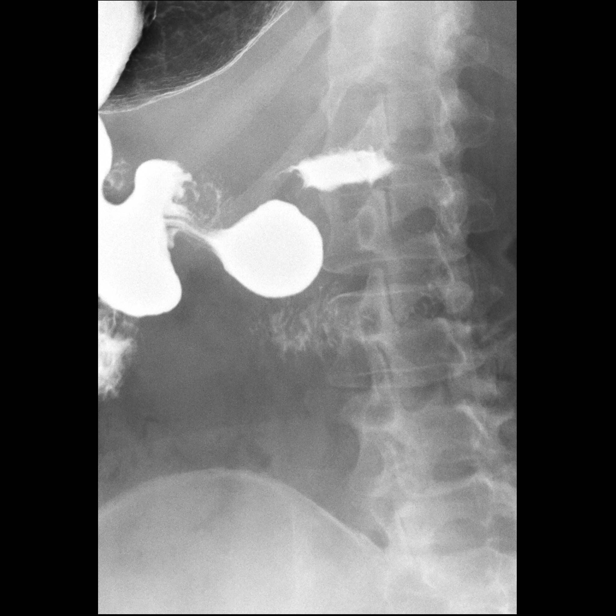
[im 2/4]
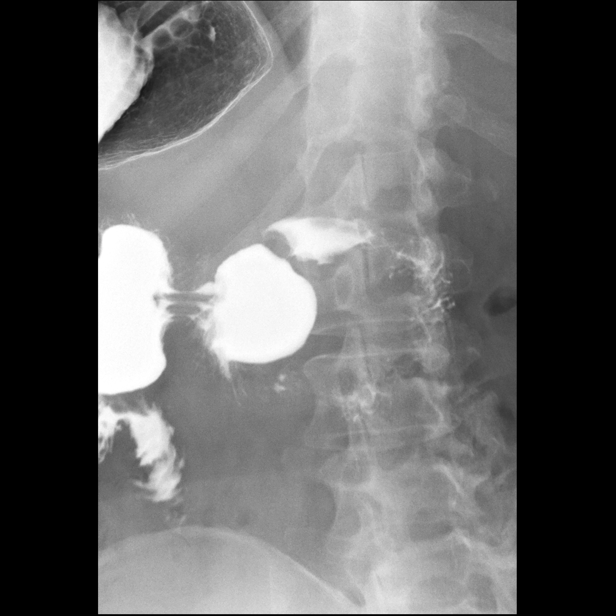
[im 4/4]
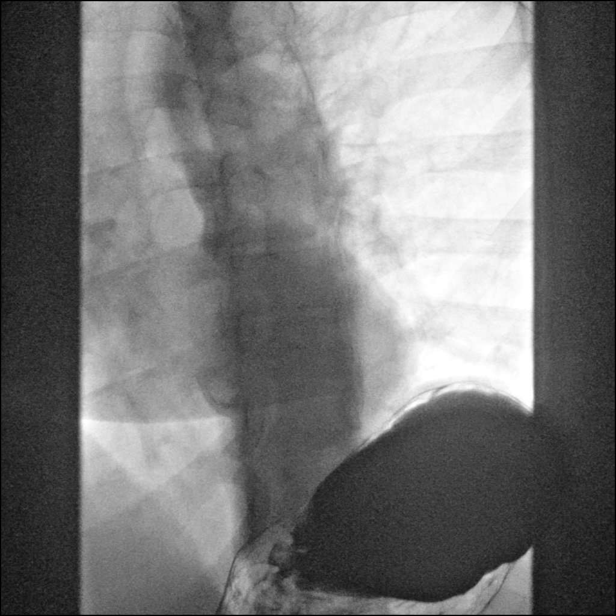

[15 of 23 positions shown; findings below may reference images not displayed]

FINDINGS: Mild esophageal dysmotility. Negative for stricture or mass. Small
hiatal hernia with mild gastroesophageal reflux while drinking water
supine.

Stomach is normal.  Duodenal bulb normal.  No ulcer or mass.
IMPRESSION: Small hiatal hernia with mild gastroesophageal reflux.

## 2019-06-07 ENCOUNTER — Ambulatory Visit: Payer: BC Managed Care – PPO | Admitting: Podiatry

## 2019-06-07 ENCOUNTER — Encounter: Payer: Self-pay | Admitting: Podiatry

## 2019-06-07 ENCOUNTER — Other Ambulatory Visit: Payer: Self-pay

## 2019-06-07 ENCOUNTER — Ambulatory Visit (INDEPENDENT_AMBULATORY_CARE_PROVIDER_SITE_OTHER): Payer: BC Managed Care – PPO

## 2019-06-07 ENCOUNTER — Other Ambulatory Visit: Payer: Self-pay | Admitting: Podiatry

## 2019-06-07 VITALS — Temp 97.5°F

## 2019-06-07 DIAGNOSIS — J302 Other seasonal allergic rhinitis: Secondary | ICD-10-CM | POA: Insufficient documentation

## 2019-06-07 DIAGNOSIS — M778 Other enthesopathies, not elsewhere classified: Secondary | ICD-10-CM

## 2019-06-07 DIAGNOSIS — M7751 Other enthesopathy of right foot: Secondary | ICD-10-CM

## 2019-06-07 DIAGNOSIS — E785 Hyperlipidemia, unspecified: Secondary | ICD-10-CM

## 2019-06-07 DIAGNOSIS — M2041 Other hammer toe(s) (acquired), right foot: Secondary | ICD-10-CM | POA: Diagnosis not present

## 2019-06-07 DIAGNOSIS — I10 Essential (primary) hypertension: Secondary | ICD-10-CM | POA: Insufficient documentation

## 2019-06-07 NOTE — Progress Notes (Signed)
  Subjective:  Patient ID: Bethany Day, female    DOB: 1955/08/11,  MRN: [id number]  Chief Complaint  Patient presents with  . Foot Pain    R dorsal forefoot, submet 2. x1-2 months. Pt stated, "I had bunion and hammertoe surgery 10 years ago. The tenderness/pain = 0.10-0.25/10. Just want to make sure everything is okay. Walking triggers/increases the discomfort, and only rest helps".    64 y.o. female presents with the above complaint. History confirmed with patient.    Objective:  Physical Exam: warm, good capillary refill, no trophic changes or ulcerative lesions, normal DP and PT pulses and normal sensory exam. Left Foot: normal exam, no swelling, tenderness, instability; ligaments intact, full range of motion of all ankle/foot joints  Right Foot: POP Right Dorsal 2nd MPJ. No pain on ROM. Decreased ROM R 1st MPJ   No images are attached to the encounter.  Radiographs: X-ray of the right foot: no fracture, dislocation, swelling or degenerative changes noted . Elongated 2nd/3rd metatarsal. 1st MPJ stable changes s/p bunionectomy with pin fixation Assessment:   1. Hammertoe of right foot   2. Capsulitis of metatarsophalangeal (MTP) joint of right foot   3. Seasonal allergies   4. Hyperlipidemia, unspecified hyperlipidemia type      Plan:  Patient was evaluated and treated and all questions answered.  Capsulitis -Educated on etiology -XR reviewed with patient -Injection delivered to the painful joint -Discussed stretching exercises of the joint  Procedure: Joint Injection  Location: Right 2nd MPJ joint Skin Prep: Betadine. Injectate: 0.5 cc 1% lidocaine plain, 0.5 cc dexamethasone phosphate. Disposition: Patient tolerated procedure well. Injection site dressed with a band-aid.   No follow-ups on file.

## 2020-01-01 ENCOUNTER — Ambulatory Visit: Payer: BC Managed Care – PPO | Attending: Internal Medicine

## 2020-01-01 DIAGNOSIS — Z23 Encounter for immunization: Secondary | ICD-10-CM

## 2020-01-01 NOTE — Progress Notes (Signed)
   Covid-19 Vaccination Clinic  Name:  Bethany Day    MRN: [id number] DOB: 05/26/1955  01/01/2020  Bethany Day was observed post Covid-19 immunization for 15 minutes without incident. She was provided with Vaccine Information Sheet and instruction to access the V-Safe system.   Bethany Day was instructed to call 911 with any severe reactions post vaccine: Marland Kitchen Difficulty breathing  . Swelling of face and throat  . A fast heartbeat  . A bad rash all over body  . Dizziness and weakness

## 2021-03-10 ENCOUNTER — Ambulatory Visit: Payer: BC Managed Care – PPO | Attending: Family Medicine

## 2021-03-10 ENCOUNTER — Other Ambulatory Visit: Payer: Self-pay

## 2021-03-10 DIAGNOSIS — M25511 Pain in right shoulder: Secondary | ICD-10-CM | POA: Insufficient documentation

## 2021-03-10 DIAGNOSIS — G8929 Other chronic pain: Secondary | ICD-10-CM | POA: Diagnosis present

## 2021-03-10 DIAGNOSIS — M7541 Impingement syndrome of right shoulder: Secondary | ICD-10-CM | POA: Diagnosis present

## 2021-03-10 DIAGNOSIS — M25611 Stiffness of right shoulder, not elsewhere classified: Secondary | ICD-10-CM | POA: Insufficient documentation

## 2021-03-10 NOTE — Therapy (Addendum)
OUTPATIENT PHYSICAL THERAPY SHOULDER EVALUATION/Discharge   Patient Name: Bethany Day MRN: [id number] DOB:April 01, 1955, 66 y.o., female Today's Date: 03/10/2021   PT End of Session - 03/10/21 1637     Visit Number 1    Number of Visits 13    Date for PT Re-Evaluation 04/24/21    Authorization Type [employer]    Progress Note Due on Visit 10    PT Start Time 1633    PT Stop Time 1720    PT Time Calculation (min) 47 min    Activity Tolerance Patient tolerated treatment well    Behavior During Therapy WFL for tasks assessed/performed             Past Medical History:  Diagnosis Date   Allergy    Arthritis    Asthma    High cholesterol    Hypertension    Past Surgical History:  Procedure Laterality Date   AXILLARY SURGERY     CHEST WALL TUMOR EXCISION  2012   skin tags   ECTOPIC PREGNANCY SURGERY     FOOT SURGERY     HAND SURGERY     left axill  2012   sebaceous cyst   left knee     leg mass  2012   left leg skin mass   TONSILLECTOMY     Patient Active Problem List   Diagnosis Date Noted   HTN (hypertension) 06/07/2019   Seasonal allergies 06/07/2019   Hyperlipidemia 06/07/2019    PCP: Wenda Low, MD  REFERRING PROVIDER: Gentry Fitz, MD  REFERRING DIAG: Rotator cuff impingement syndrome of right shoulder  THERAPY DIAG:  Rotator cuff impingement syndrome of right shoulder  Chronic right shoulder pain  Decreased ROM of right shoulder   ONSET DATE: 1.5 years  SUBJECTIVE:                                                                                                                                                                                      SUBJECTIVE STATEMENT: Pt reports R shoulder pain for 1.5 years which over the past 4 months has gotten wrose and is limiting her use of the R UE. Pt denies a MOI. A cortisone Injection received 02/19/22 has helped the R shoulder to feel better.  PERTINENT HISTORY: Arthritis,  HTN  PAIN:  Are you having pain? Yes VAS scale: .5/10 up to 1/10; before the injection 5/10 Pain location: R GH area and R bicep belly area Pain orientation: Right  PAIN TYPE: aching Pain description: intermittent  Aggravating factors: Reaching above head and reaching in front or out to the side Relieving factors: Rest   PRECAUTIONS:  None  WEIGHT BEARING RESTRICTIONS No  FALLS:  Has patient fallen in last 6 months? Yes Number of falls: 1, Slip. Pt is not concerned with her balance.  LIVING ENVIRONMENT: No issue  OCCUPATION: Professor of nursing, primarily works on a computer  PLOF: Independent  PATIENT GOALS: Full ROM of R shoulder c no pain  OBJECTIVE:   DIAGNOSTIC FINDINGS:  Pt reports an xray revealed arthritis and decreased cartilage  PATIENT SURVEYS:  FOTO 50% perceived functional status ; predicted value 63%  COGNITION:  Overall cognitive status: Within functional limits for tasks assessed     SENSATION:  Light touch: Appears intact   POSTURE: Forward head c CT step off, rounded shoulders  PALPATION: TTP the lateral supra and infraspinatus muscles  UPPER EXTREMITY AROM/PROM:  A/PROM Right 03/10/2021 Left 03/10/2021  Shoulder flexion Long lever 67; Short lever 130 135  Shoulder extension    Shoulder abduction 130 135  Shoulder adduction    Shoulder internal rotation T9 T9  Shoulder external rotation T1 T3  Elbow flexion    Elbow extension    Wrist flexion    Wrist extension    Wrist ulnar deviation    Wrist radial deviation    Wrist pronation    Wrist supination    (Blank rows = not tested)  UPPER EXTREMITY MMT:  MMT Right 03/10/2021 Left 03/10/2021  Shoulder flexion 5   Shoulder extension 5   Shoulder abduction 5   Shoulder adduction 5   ER 5   IR 5   Elbow flexion    Elbow extension    Wrist flexion    Wrist extension    Wrist ulnar deviation    Wrist radial deviation    Wrist pronation    Wrist supination    Grip strength  (lbs)    (Blank rows = not tested)  -MMT R shoulder was completed in a neutral position  SHOULDER SPECIAL TESTS:  Impingement tests: Hawkins/Kennedy impingement test: positive  and Painful arc test: positive   Rotator cuff assessment: Empty can test: painful, but strong and Full can test: painful, but strong  Biceps assessment: Yergason's test: negative and Speed's test: negative   TODAY'S TREATMENT:  Shoulder row x10, RTB Shoulder ext x10, RTB   PATIENT EDUCATION: Education details: Eval findings, POC, HEP, sleeping positions and support for comfort Person educated: Patient Education method: Explanation, Demonstration, Tactile cues, Verbal cues, and Handouts Education comprehension: verbalized understanding, returned demonstration, verbal cues required, and tactile cues required   HOME EXERCISE PROGRAM: Access Code: [email or web address] URL: [email or web address] Date: 03/10/2021 Prepared by: Gar Ponto  Exercises Standing Shoulder Row with Anchored Resistance - 1 x daily - 7 x weekly - 2 sets - 10 reps - 3 hold Shoulder extension with resistance - Neutral - 1 x daily - 7 x weekly - 2 sets - 10 reps - 3 hold   ASSESSMENT:  CLINICAL IMPRESSION: Patient is a 66 y.o. F who was seen today for physical therapy evaluation and treatment for Rotator cuff impingement syndrome of right shoulder. Objective impairments include decreased ROM, decreased strength, impaired UE functional use, postural dysfunction, and pain. These impairments are limiting patient from cleaning, driving, occupation, and yard work. Personal factors including Age, Fitness, Time since onset of injury/illness/exacerbation, and 1 comorbidity: arthritis  are also affecting patient's functional outcome. Patient will benefit from skilled PT to address above impairments and improve overall function.  REHAB POTENTIAL: Good  CLINICAL DECISION MAKING: Stable/uncomplicated  EVALUATION COMPLEXITY:  Low   GOALS:  SHORT TERM GOALS:  STG Name Target Date Goal status  1 Ind in an initial eval Baseline: Started on eval 03/31/21 INITIAL  2 Pt will voice understanding of measures to decrease and manage the R shoulder pain Baseline:  03/31/21 INITIAL   LONG TERM GOALS:   LTG Name Target Date Goal status  1 Pt will be able to complete R shoulder elevation with long lever to 130d without provocation of pain Baseline:pain at 67d of flexion 04/24/21 INITIAL  2 Pt will be able to brush hair without provocation of pain Baseline:has pain c activity 04/24/21 INITIAL  3 Pt will be able to sleep through the night without provocation of pain Baseline: 04/24/21 INITIAL  4 Pt will be able to demonstrate proper sitting posture Baseline: 04/24/21 INITIAL  5 Pt's FOT score will improve to the predicted value of 63% 04/24/21 INITIAL   PLAN: PT FREQUENCY: 1x/week  PT DURATION: 6 weeks  PLANNED INTERVENTIONS: Therapeutic exercises, Therapeutic activity, Neuro Muscular re-education, Patient/Family education, Joint mobilization, Dry Needling, Electrical stimulation, Cryotherapy, Moist heat, Taping, Vasopneumatic device, Ultrasound, Ionotophoresis 78m/ml Dexamethasone, and Manual therapy  PLAN FOR NEXT SESSION: Assess response to HEP. Review FOTO. Use of modalities and manual therapy as indicated. Instruct in proper sitting posture.  Allen Ralls MS, PT 03/11/21 7:56 AM  PHYSICAL THERAPY DISCHARGE SUMMARY  Visits from Start of Care: 1 eval only  Current functional level related to goals / functional outcomes: unknown   Remaining deficits: unknown   Education / Equipment: HEP   Patient agrees to discharge. Patient goals were not met. Patient is being discharged due to not returning since the last visit.   Allen Ralls MS, PT 09/08/21 1:57 PM

## 2021-03-18 ENCOUNTER — Other Ambulatory Visit: Payer: Self-pay

## 2021-03-18 ENCOUNTER — Ambulatory Visit: Payer: BC Managed Care – PPO

## 2021-03-18 NOTE — Therapy (Incomplete)
OUTPATIENT PHYSICAL THERAPY TREATMENT NOTE   Patient Name: Bethany Day MRN: [id number] DOB:[birth year], 66 y.o., female Today's Date: 03/18/2021  PCP: Wenda Low, MD REFERRING PROVIDER: Wenda Low, MD    Past Medical History:  Diagnosis Date   Allergy    Arthritis    Asthma    High cholesterol    Hypertension    Past Surgical History:  Procedure Laterality Date   AXILLARY SURGERY     CHEST WALL TUMOR EXCISION  2012   skin tags   ECTOPIC PREGNANCY SURGERY     FOOT SURGERY     HAND SURGERY     left axill  2012   sebaceous cyst   left knee     leg mass  2012   left leg skin mass   TONSILLECTOMY     Patient Active Problem List   Diagnosis Date Noted   HTN (hypertension) 06/07/2019   Seasonal allergies 06/07/2019   Hyperlipidemia 06/07/2019    REFERRING DIAG: Rotator cuff impingement syndrome of right shoulder  THERAPY DIAG:  No diagnosis found.  PERTINENT HISTORY: ***  PRECAUTIONS: ***   SUBJECTIVE STATEMENT: Pt reports R shoulder pain for 1.5 years which over the past 4 months has gotten wrose and is limiting her use of the R UE. Pt denies a MOI. A cortisone Injection received 02/19/22 has helped the R shoulder to feel better.   PERTINENT HISTORY: Arthritis, HTN   PAIN:  Are you having pain? Yes VAS scale: .5/10 up to 1/10; before the injection 5/10 Pain location: R GH area and R bicep belly area Pain orientation: Right  PAIN TYPE: aching Pain description: intermittent  Aggravating factors: Reaching above head and reaching in front or out to the side Relieving factors: Rest    PRECAUTIONS: None   WEIGHT BEARING RESTRICTIONS No   FALLS:  Has patient fallen in last 6 months? Yes Number of falls: 1, Slip. Pt is not concerned with her balance.   LIVING ENVIRONMENT: No issue   OCCUPATION: Professor of nursing, primarily works on a computer   PLOF: Independent   PATIENT GOALS: Full ROM of R shoulder c no pain   OBJECTIVE:     DIAGNOSTIC FINDINGS:  Pt reports an xray revealed arthritis and decreased cartilage   PATIENT SURVEYS:  FOTO 50% perceived functional status ; predicted value 63%   COGNITION:          Overall cognitive status: Within functional limits for tasks assessed                               SENSATION:          Light touch: Appears intact     POSTURE: Forward head c CT step off, rounded shoulders   PALPATION: TTP the lateral supra and infraspinatus muscles   UPPER EXTREMITY AROM/PROM:   A/PROM Right 03/10/2021 Left 03/10/2021  Shoulder flexion Long lever 67; Short lever 130 135  Shoulder extension      Shoulder abduction 130 135  Shoulder adduction      Shoulder internal rotation T9 T9  Shoulder external rotation T1 T3  Elbow flexion      Elbow extension      Wrist flexion      Wrist extension      Wrist ulnar deviation      Wrist radial deviation      Wrist pronation      Wrist supination      (  Blank rows = not tested)   UPPER EXTREMITY MMT:   MMT Right 03/10/2021 Left 03/10/2021  Shoulder flexion 5    Shoulder extension 5    Shoulder abduction 5    Shoulder adduction 5    ER 5    IR 5    Elbow flexion      Elbow extension      Wrist flexion      Wrist extension      Wrist ulnar deviation      Wrist radial deviation      Wrist pronation      Wrist supination      Grip strength (lbs)      (Blank rows = not tested)   -MMT R shoulder was completed in a neutral position   SHOULDER SPECIAL TESTS:           Impingement tests: Hawkins/Kennedy impingement test: positive  and Painful arc test: positive            Rotator cuff assessment: Empty can test: painful, but strong and Full can test: painful, but strong           Biceps assessment: Yergason's test: negative and Speed's test: negative            TODAY'S TREATMENT:  Shoulder row x10, RTB Shoulder ext x10, RTB     PATIENT EDUCATION: Education details: Eval findings, POC, HEP, sleeping positions and  support for comfort Person educated: Patient Education method: Explanation, Demonstration, Tactile cues, Verbal cues, and Handouts Education comprehension: verbalized understanding, returned demonstration, verbal cues required, and tactile cues required     HOME EXERCISE PROGRAM: Access Code: [email or web address] URL: [email or web address] Date: 03/10/2021 Prepared by: Gar Ponto   Exercises Standing Shoulder Row with Anchored Resistance - 1 x daily - 7 x weekly - 2 sets - 10 reps - 3 hold Shoulder extension with resistance - Neutral - 1 x daily - 7 x weekly - 2 sets - 10 reps - 3 hold     ASSESSMENT:   CLINICAL IMPRESSION: Patient is a 66 y.o. F who was seen today for physical therapy evaluation and treatment for Rotator cuff impingement syndrome of right shoulder. Objective impairments include decreased ROM, decreased strength, impaired UE functional use, postural dysfunction, and pain. These impairments are limiting patient from cleaning, driving, occupation, and yard work. Personal factors including Age, Fitness, Time since onset of injury/illness/exacerbation, and 1 comorbidity: arthritis  are also affecting patient's functional outcome. Patient will benefit from skilled PT to address above impairments and improve overall function.   REHAB POTENTIAL: Good   CLINICAL DECISION MAKING: Stable/uncomplicated   EVALUATION COMPLEXITY: Low     GOALS:     SHORT TERM GOALS:   STG Name Target Date Goal status  1 Ind in an initial eval Baseline: Started on eval 03/31/21 INITIAL  2 Pt will voice understanding of measures to decrease and manage the R shoulder pain Baseline:  03/31/21 INITIAL    LONG TERM GOALS:    LTG Name Target Date Goal status  1 Pt will be able to complete R shoulder elevation with long lever to 130d without provocation of pain Baseline:pain at 67d of flexion 04/24/21 INITIAL  2 Pt will be able to brush hair without provocation of pain Baseline:has pain c  activity 04/24/21 INITIAL  3 Pt will be able to sleep through the night without provocation of pain Baseline: 04/24/21 INITIAL  4 Pt will be able to demonstrate proper sitting posture Baseline: 04/24/21 INITIAL  5 Pt's FOT score will improve to the predicted value of 63% 04/24/21 INITIAL    PLAN: PT FREQUENCY: 1x/week   PT DURATION: 6 weeks   PLANNED INTERVENTIONS: Therapeutic exercises, Therapeutic activity, Neuro Muscular re-education, Patient/Family education, Joint mobilization, Dry Needling, Electrical stimulation, Cryotherapy, Moist heat, Taping, Vasopneumatic device, Ultrasound, Ionotophoresis 4mg /ml Dexamethasone, and Manual therapy   PLAN FOR NEXT SESSION: Assess response to HEP. Review FOTO. Use of modalities and manual therapy as indicated. Instruct in proper sitting posture.     Tavyn Kurka MS, PT 03/11/21 7:56 AM   Gar Ponto 03/18/2021, 11:07 AM

## 2021-08-10 ENCOUNTER — Other Ambulatory Visit: Payer: Self-pay | Admitting: Internal Medicine

## 2021-08-10 DIAGNOSIS — Z1231 Encounter for screening mammogram for malignant neoplasm of breast: Secondary | ICD-10-CM

## 2021-08-27 ENCOUNTER — Ambulatory Visit: Payer: BC Managed Care – PPO

## 2021-09-04 ENCOUNTER — Ambulatory Visit
Admission: RE | Admit: 2021-09-04 | Discharge: 2021-09-04 | Disposition: A | Discharge status: Home / Self Care | Payer: BC Managed Care – PPO | Source: Ambulatory Visit | Attending: Internal Medicine | Admitting: Internal Medicine

## 2021-09-04 DIAGNOSIS — Z1231 Encounter for screening mammogram for malignant neoplasm of breast: Secondary | ICD-10-CM

## 2021-10-02 ENCOUNTER — Inpatient Hospital Stay
Admission: RE | Admit: 2021-10-02 | Discharge: 2021-10-02 | Disposition: A | Discharge status: Home / Self Care | Payer: BC Managed Care – PPO | Source: Ambulatory Visit | Attending: Internal Medicine | Admitting: Internal Medicine

## 2021-10-02 ENCOUNTER — Encounter (HOSPITAL_BASED_OUTPATIENT_CLINIC_OR_DEPARTMENT_OTHER): Payer: Self-pay | Admitting: *Deleted

## 2021-10-02 ENCOUNTER — Emergency Department (HOSPITAL_BASED_OUTPATIENT_CLINIC_OR_DEPARTMENT_OTHER)
Admission: EM | Admit: 2021-10-02 | Discharge: 2021-10-02 | Disposition: A | Discharge status: Home / Self Care | Payer: BC Managed Care – PPO | Attending: Emergency Medicine | Admitting: Emergency Medicine

## 2021-10-02 ENCOUNTER — Other Ambulatory Visit: Payer: Self-pay

## 2021-10-02 ENCOUNTER — Emergency Department (HOSPITAL_BASED_OUTPATIENT_CLINIC_OR_DEPARTMENT_OTHER): Payer: BC Managed Care – PPO

## 2021-10-02 DIAGNOSIS — S76112A Strain of left quadriceps muscle, fascia and tendon, initial encounter: Secondary | ICD-10-CM | POA: Diagnosis not present

## 2021-10-02 DIAGNOSIS — M25562 Pain in left knee: Secondary | ICD-10-CM | POA: Diagnosis not present

## 2021-10-02 DIAGNOSIS — W19XXXA Unspecified fall, initial encounter: Secondary | ICD-10-CM | POA: Diagnosis not present

## 2021-10-02 DIAGNOSIS — Z91040 Latex allergy status: Secondary | ICD-10-CM | POA: Diagnosis not present

## 2021-10-02 DIAGNOSIS — S8992XA Unspecified injury of left lower leg, initial encounter: Secondary | ICD-10-CM | POA: Diagnosis present

## 2021-10-02 MED ORDER — IBUPROFEN 800 MG PO TABS
800.0000 mg | ORAL_TABLET | Freq: Once | ORAL | Status: AC
Start: 2021-10-02 — End: 2021-10-02
  Administered 2021-10-02: 800 mg via ORAL
  Filled 2021-10-02: qty 1

## 2021-10-02 NOTE — ED Triage Notes (Signed)
Approx 1030hrs today client fell onto hardwood floor, injury to left knee. Points to ant area of left patella where pain is the worse

## 2021-10-02 NOTE — ED Provider Notes (Signed)
[facility] DEPART[facility] Provider Note   CSN: [id number] Arrival date & time: 10/02/21  1457     History  Chief Complaint  Patient presents with   Knee Pain    XARENI KELCH is a 66 y.o. female with past medical history significant for known osteoarthritis of both knees who presents with concern for left knee pain after fall and injuring it earlier today when she reports her right knee gave out.  Patient denies any numbness, tingling.  She reports some pain with bending of the knee, but cannot lay, and walk with a fairly straight leg without significant pain.  She is not taking anything for pain prior to arrival.  She reports that she has a follow-up appointment already scheduled with orthopedics for Monday.   Knee Pain      Home Medications Prior to Admission medications   Medication Sig Start Date End Date Taking? Authorizing Provider  amLODipine-benazepril (LOTREL) 5-20 MG capsule Take 1 capsule by mouth daily. 07/16/15   [provider]  atorvastatin (LIPITOR) 10 MG tablet Take 10 mg by mouth daily.    [provider]  glucosamine-chondroitin 500-400 MG tablet Take 1 tablet by mouth 3 (three) times daily.    [provider]  Multiple Vitamin (MULTIVITAMIN) tablet Take 1 tablet by mouth daily.    [provider]      Allergies    Latex    Review of Systems   Review of Systems  Musculoskeletal:  Positive for arthralgias and joint swelling.  All other systems reviewed and are negative.   Physical Exam Updated Vital Signs BP (!) 152/79 (BP Location: Right Arm)   Pulse 63   Temp 98.3 F (36.8 C) (Oral)   Resp 18   SpO2 98%  Physical Exam Vitals and nursing note reviewed.  Constitutional:      General: She is not in acute distress.    Appearance: Normal appearance.  HENT:     Head: Normocephalic and atraumatic.  Eyes:     General:        Right eye: No discharge.        Left eye: No discharge.   Cardiovascular:     Rate and Rhythm: Normal rate and regular rhythm.  Pulmonary:     Effort: Pulmonary effort is normal. No respiratory distress.  Musculoskeletal:        General: No deformity.     Comments: Patient with intact range of motion passively of the left knee, she has some significant tenderness palpation at the patellar tendon without evidence of rupture high riding patella.  No significant medial or lateral knee injury noted, no significant effusion noted.  Negative balloon/ballottement.  Negative McMurray.  No knee laxity noted on evaluation of collateral ligaments.  Skin:    General: Skin is warm and dry.  Neurological:     Mental Status: She is alert and oriented to person, place, and time.  Psychiatric:        Mood and Affect: Mood normal.        Behavior: Behavior normal.     ED Results / Procedures / Treatments   Labs (all labs ordered are listed, but only abnormal results are displayed) Labs Reviewed - No data to display  EKG None  Radiology DG Knee Complete 4 Views Left  Result Date: 10/02/2021 CLINICAL DATA:  Patient fell today around 10:30 a.m. Landed around patella. Pain and limited range of motion. EXAM: LEFT KNEE - COMPLETE 4+ VIEW COMPARISON:  None Available. FINDINGS: No evidence of fracture or dislocation. Tricompartmental joint space narrowing with marginal osteophytes most prominent in the patellofemoral compartment. Small suprapatellar joint effusion. Soft tissues are unremarkable. IMPRESSION: 1. No evidence of fracture or dislocation. 2. 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 Electronically Signed   By: Keane Police D.O.   On: 10/02/2021 15:40    Procedures Procedures    Medications Ordered in ED Medications  ibuprofen (ADVIL) tablet 800 mg (800 mg Oral Given 10/02/21 1520)    ED Course/ Medical Decision Making/ A&P                           Medical Decision Making Amount and/or Complexity of Data  Reviewed Radiology: ordered.  Risk Prescription drug management.   This an overall well-appearing 66 year old female who presents with concern for left knee injury after mechanical, nonsyncopal fall.  Physical exam reveals neurovascularly intact left lower extremity with tenderness of the patellar tendon without collateral ligament laxity, joint effusion.  I independently interpreted imaging including plain film radiographs of the left knee which shows no evidence of acute fracture, dislocation, significant arthritis changes noted. I agree with the radiologist interpretation.  I have suspicion of soft tissue injury, as well as patellar tendon strain, generalized left knee pain after injury.  Given patient's significant arthritis bilaterally think that would be reasonable to provide walker, and knee brace.  Encourage close orthopedic follow-up, rest, ice, compression, elevation.  Patient discharged in stable condition at this time.  Final Clinical Impression(s) / ED Diagnoses Final diagnoses:  Acute pain of left knee  Strain of left quadriceps tendon, initial encounter    Rx / DC Orders ED Discharge Orders     None         Dorien Chihuahua 10/02/21 1623    Charlesetta Shanks, MD 10/08/21 2135

## 2021-10-02 NOTE — ED Notes (Signed)
[place] today on hardwood floor and presents with pain in left knee area. Strong left pedal pulse noted, strong plantar and dorsal flexion noted on left foot. Warm and dry, normal capillary refill noted, normal sensation in left foot

## 2021-10-02 NOTE — Discharge Instructions (Addendum)
As we discussed you have some left knee pain that is likely exacerbated by your baseline arthritis, acutely today I suspect you have a strain of the left quadriceps tendon.  Please use Tylenol or ibuprofen for pain.  You may use 600 mg ibuprofen every 6 hours or 1000 mg of Tylenol every 6 hours.  You may choose to alternate between the 2.  This would be most effective.  Not to exceed 4 g of Tylenol within 24 hours.  Not to exceed 3200 mg ibuprofen 24 hours.  I recommend rest, ice, compression, elevation of the affected extremity.  You can use a walker for assistance over the weekend.  Please report to your orthopedic doctor on Monday for further evaluation.  Please return to the emergency department if your symptoms worsen or fail to improve despite treatment.

## 2022-08-10 ENCOUNTER — Other Ambulatory Visit: Payer: Self-pay | Admitting: Internal Medicine

## 2022-08-10 DIAGNOSIS — Z1231 Encounter for screening mammogram for malignant neoplasm of breast: Secondary | ICD-10-CM

## 2022-09-07 ENCOUNTER — Ambulatory Visit
Admission: RE | Admit: 2022-09-07 | Discharge: 2022-09-07 | Disposition: A | Discharge status: Home / Self Care | Payer: Medicare Other | Source: Ambulatory Visit | Attending: Internal Medicine | Admitting: Internal Medicine

## 2022-09-07 DIAGNOSIS — Z1231 Encounter for screening mammogram for malignant neoplasm of breast: Secondary | ICD-10-CM

## 2023-01-13 ENCOUNTER — Other Ambulatory Visit: Payer: Self-pay | Admitting: Obstetrics

## 2023-01-13 DIAGNOSIS — E2839 Other primary ovarian failure: Secondary | ICD-10-CM

## 2023-08-11 DIAGNOSIS — K219 Gastro-esophageal reflux disease without esophagitis: Secondary | ICD-10-CM | POA: Diagnosis not present

## 2023-08-11 DIAGNOSIS — E7800 Pure hypercholesterolemia, unspecified: Secondary | ICD-10-CM | POA: Diagnosis not present

## 2023-08-11 DIAGNOSIS — M179 Osteoarthritis of knee, unspecified: Secondary | ICD-10-CM | POA: Diagnosis not present

## 2023-08-11 DIAGNOSIS — Z0000 Encounter for general adult medical examination without abnormal findings: Secondary | ICD-10-CM | POA: Diagnosis not present

## 2023-08-11 DIAGNOSIS — Z23 Encounter for immunization: Secondary | ICD-10-CM | POA: Diagnosis not present

## 2023-08-11 DIAGNOSIS — J4520 Mild intermittent asthma, uncomplicated: Secondary | ICD-10-CM | POA: Diagnosis not present

## 2023-08-11 DIAGNOSIS — R7303 Prediabetes: Secondary | ICD-10-CM | POA: Diagnosis not present

## 2023-08-11 DIAGNOSIS — I10 Essential (primary) hypertension: Secondary | ICD-10-CM | POA: Diagnosis not present

## 2023-08-28 DIAGNOSIS — I10 Essential (primary) hypertension: Secondary | ICD-10-CM | POA: Diagnosis not present

## 2023-08-29 DIAGNOSIS — I10 Essential (primary) hypertension: Secondary | ICD-10-CM | POA: Diagnosis not present

## 2023-08-29 DIAGNOSIS — E7800 Pure hypercholesterolemia, unspecified: Secondary | ICD-10-CM | POA: Diagnosis not present

## 2023-08-29 DIAGNOSIS — J4520 Mild intermittent asthma, uncomplicated: Secondary | ICD-10-CM | POA: Diagnosis not present

## 2023-09-09 DIAGNOSIS — M67911 Unspecified disorder of synovium and tendon, right shoulder: Secondary | ICD-10-CM | POA: Diagnosis not present

## 2023-09-27 DIAGNOSIS — I10 Essential (primary) hypertension: Secondary | ICD-10-CM | POA: Diagnosis not present

## 2023-10-30 DIAGNOSIS — J4520 Mild intermittent asthma, uncomplicated: Secondary | ICD-10-CM | POA: Diagnosis not present

## 2023-10-30 DIAGNOSIS — I10 Essential (primary) hypertension: Secondary | ICD-10-CM | POA: Diagnosis not present

## 2023-10-30 DIAGNOSIS — E7800 Pure hypercholesterolemia, unspecified: Secondary | ICD-10-CM | POA: Diagnosis not present

## 2023-11-03 DIAGNOSIS — I10 Essential (primary) hypertension: Secondary | ICD-10-CM | POA: Diagnosis not present

## 2023-11-29 DIAGNOSIS — I10 Essential (primary) hypertension: Secondary | ICD-10-CM | POA: Diagnosis not present

## 2023-11-29 DIAGNOSIS — J4520 Mild intermittent asthma, uncomplicated: Secondary | ICD-10-CM | POA: Diagnosis not present

## 2023-11-29 DIAGNOSIS — E7800 Pure hypercholesterolemia, unspecified: Secondary | ICD-10-CM | POA: Diagnosis not present

## 2023-11-30 DIAGNOSIS — M79641 Pain in right hand: Secondary | ICD-10-CM | POA: Diagnosis not present

## 2023-11-30 DIAGNOSIS — M67911 Unspecified disorder of synovium and tendon, right shoulder: Secondary | ICD-10-CM | POA: Diagnosis not present

## 2023-11-30 DIAGNOSIS — M65312 Trigger thumb, left thumb: Secondary | ICD-10-CM | POA: Diagnosis not present

## 2023-12-01 DIAGNOSIS — M7541 Impingement syndrome of right shoulder: Secondary | ICD-10-CM | POA: Diagnosis not present

## 2023-12-03 DIAGNOSIS — I10 Essential (primary) hypertension: Secondary | ICD-10-CM | POA: Diagnosis not present

## 2023-12-06 DIAGNOSIS — M7541 Impingement syndrome of right shoulder: Secondary | ICD-10-CM | POA: Diagnosis not present

## 2023-12-13 DIAGNOSIS — M7541 Impingement syndrome of right shoulder: Secondary | ICD-10-CM | POA: Diagnosis not present

## 2023-12-16 DIAGNOSIS — M7541 Impingement syndrome of right shoulder: Secondary | ICD-10-CM | POA: Diagnosis not present

## 2023-12-20 DIAGNOSIS — M7541 Impingement syndrome of right shoulder: Secondary | ICD-10-CM | POA: Diagnosis not present

## 2023-12-22 DIAGNOSIS — M7541 Impingement syndrome of right shoulder: Secondary | ICD-10-CM | POA: Diagnosis not present

## 2023-12-27 DIAGNOSIS — M7541 Impingement syndrome of right shoulder: Secondary | ICD-10-CM | POA: Diagnosis not present

## 2023-12-30 DIAGNOSIS — I10 Essential (primary) hypertension: Secondary | ICD-10-CM | POA: Diagnosis not present

## 2023-12-30 DIAGNOSIS — J4520 Mild intermittent asthma, uncomplicated: Secondary | ICD-10-CM | POA: Diagnosis not present

## 2023-12-30 DIAGNOSIS — E7800 Pure hypercholesterolemia, unspecified: Secondary | ICD-10-CM | POA: Diagnosis not present

## 2024-01-02 DIAGNOSIS — I10 Essential (primary) hypertension: Secondary | ICD-10-CM | POA: Diagnosis not present

## 2024-01-04 DIAGNOSIS — M65312 Trigger thumb, left thumb: Secondary | ICD-10-CM | POA: Diagnosis not present

## 2024-01-04 DIAGNOSIS — M67813 Other specified disorders of tendon, right shoulder: Secondary | ICD-10-CM | POA: Diagnosis not present

## 2024-01-06 DIAGNOSIS — M7541 Impingement syndrome of right shoulder: Secondary | ICD-10-CM | POA: Diagnosis not present

## 2024-01-10 DIAGNOSIS — M25561 Pain in right knee: Secondary | ICD-10-CM | POA: Diagnosis not present

## 2024-01-10 DIAGNOSIS — M7541 Impingement syndrome of right shoulder: Secondary | ICD-10-CM | POA: Diagnosis not present

## 2024-01-12 DIAGNOSIS — M7541 Impingement syndrome of right shoulder: Secondary | ICD-10-CM | POA: Diagnosis not present

## 2024-01-16 DIAGNOSIS — M7541 Impingement syndrome of right shoulder: Secondary | ICD-10-CM | POA: Diagnosis not present

## 2024-01-19 DIAGNOSIS — M7541 Impingement syndrome of right shoulder: Secondary | ICD-10-CM | POA: Diagnosis not present

## 2024-01-23 DIAGNOSIS — M7541 Impingement syndrome of right shoulder: Secondary | ICD-10-CM | POA: Diagnosis not present

## 2024-01-25 DIAGNOSIS — M7541 Impingement syndrome of right shoulder: Secondary | ICD-10-CM | POA: Diagnosis not present

## 2024-01-29 DIAGNOSIS — I10 Essential (primary) hypertension: Secondary | ICD-10-CM | POA: Diagnosis not present

## 2024-01-29 DIAGNOSIS — E7800 Pure hypercholesterolemia, unspecified: Secondary | ICD-10-CM | POA: Diagnosis not present

## 2024-01-29 DIAGNOSIS — J4520 Mild intermittent asthma, uncomplicated: Secondary | ICD-10-CM | POA: Diagnosis not present

## 2024-01-30 DIAGNOSIS — M7541 Impingement syndrome of right shoulder: Secondary | ICD-10-CM | POA: Diagnosis not present
